# Patient Record
Sex: Female | Born: 1995 | Race: White | Hispanic: No | Marital: Single | Smoking: Never smoker
Health system: Southern US, Community
[De-identification: ages and names within clinical notes are randomized; demographics above are authoritative.]

## PROBLEM LIST (undated history)

## (undated) DIAGNOSIS — M898X9 Other specified disorders of bone, unspecified site: Secondary | ICD-10-CM

## (undated) HISTORY — PX: WISDOM TOOTH EXTRACTION: SHX21

## (undated) HISTORY — PX: TOOTH EXTRACTION: SUR596

## (undated) HISTORY — PX: INCISE AND DRAIN ABCESS: PRO64

## (undated) HISTORY — PX: DENTAL SURGERY: SHX609

---
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
## 2016-03-08 DIAGNOSIS — H5213 Myopia, bilateral: Secondary | ICD-10-CM | POA: Diagnosis not present

## 2016-05-25 DIAGNOSIS — L700 Acne vulgaris: Secondary | ICD-10-CM | POA: Diagnosis not present
